# Patient Record
Sex: Male | Born: 1985 | Race: Black or African American | Hispanic: No | Marital: Single
Health system: Midwestern US, Community
[De-identification: ages and names within clinical notes are randomized; demographics above are authoritative.]

---

## 2020-09-27 NOTE — ED Notes (Signed)
ED Triage Note       ED Triage Adult Entered On:  09/27/2020 21:28 EDT    Performed On:  09/27/2020 21:24 EDT by Oneta Rack, RN, [place]               Triage   Numeric Rating Pain Scale :   8   Chief Complaint :   c/o smell or taste today. denies fever. c/o cough, bodyaches and weakness.   [place] Mode of Arrival :   Private vehicle   Infectious Disease Documentation :   Document assessment   Temperature Oral :   38.4 degC(Converted to: 101.1 degF)  (HI)    Heart Rate Monitored :   98 bpm   Respiratory Rate :   18 br/min   Systolic Blood Pressure :   128 mmHg   Diastolic Blood Pressure :   80 mmHg   SpO2 :   96 %   Oxygen Therapy :   Room air   Patient presentation :   None of the above   Chief Complaint or Presentation suggest infection :   Yes   Dosing Weight Obtained By :   Patient stated   Weight Dosing :   79 kg(Converted to: 174 lb 3 oz)    Height :   165 cm(Converted to: 5 ft 5 in)    Body Mass Index Dosing :   29 kg/m2   Iver Nestle - 09/27/2020 21:24 EDT   DCP GENERIC CODE   Tracking Acuity :   4   Tracking Group :   ED [employer]acking [employer]   [place] - 09/27/2020 21:24 EDT   ED General Section :   Document assessment   Pregnancy Status :   N/A   ED Allergies Section :   Document assessment   ED Reason for Visit Section :   Document assessment   ED Quick Assessment :   Patient appears awake, alert, oriented to baseline. Skin warm and dry. Moves all extremities. Respiration even and unlabored. Appears in no apparent distress.   Oneta Rack, RN, Jeannett Senior - 09/27/2020 21:24 EDT   Allergies   (As Of: 09/27/2020 21:28:46 EDT)   Allergies (Active)   No Known Allergies  Estimated Onset Date:   Unspecified ; Created By:   Merlinda Frederick; Reaction Status:   Active ; Category:   Drug ; Substance:   No Known Allergies ; Type:   Allergy ; Updated By:   Merlinda Frederick; Reviewed Date:   09/27/2020 21:25 EDT        Psycho-Social   Last 3 mo, thoughts killing self/others :   Patient denies   Right  click within box for Suspected Abuse policy link. :   None   Feels Safe Where Live :   Yes   ED Behavioral Activity Rating Scale :   4 - Quiet and awake (normal level of activity)   Iver Nestle - 09/27/2020 21:24 EDT   ED Reason for Visit   (As Of: 09/27/2020 21:28:46 EDT)   Problems(Active)    No Chronic Problems ([facility]  :NKP )  Name of Problem:   No Chronic Problems ; Recorder:   Fritz Pickerel; Code:   NKP ; Last Updated:   06/15/2015 10:47 EDT ; Life Cycle Date:   06/15/2015 ; Life Cycle Status:   Active ; Vocabulary:   [facility]          Diagnoses(Active)    Medical  screening exam  Date:   09/27/2020 ; Diagnosis Type:   Reason For Visit ; Confirmation:   Complaint of ; Clinical Dx:   Medical screening exam ; Classification:   Medical ; Clinical Service:   Emergency medicine ; Code:   PNED ; Probability:   0 ; Diagnosis Code:   [id number]

## 2020-09-27 NOTE — ED Notes (Signed)
 ED Patient Education Note     Patient Education Materials Follows:                  COVID-19 Testing, Precautions, & Infection  qXX Your COVID-19 lab test result is PENDING.   Your COVID-19 lab result is not available at this time. Please allow up 3 - 4 days for processing and continue to isolate at home for 5 days or unless you have a confirmed negative result (not detected).  Please follow the quarantine/isolation instructions on page 2.     q Your COVID-19 lab test result is POSITIVE.    Your COVID-19 lab result is positive ("detected") for the COVID-19 infection. Please follow all other discharge instructions given to you by your healthcare provider.  Please follow the quarantine/isolation instructions on page 2.    q Your COVID-19 lab test result is NEGATIVE.   Your COVID-19 lab result is negative ("not detected") for the COVID-19 infection. Please follow all other discharge instructions given to you by your healthcare provider      **** The quickest way to view or print your own test result is by the 'Patient Portal' at [email or web address] see below for further 'Patient Portal' details.  Note: In reviewing your COVID-19 result on the patient portal (under the 'results' tab) you may notice a variation in the test name that includes, "Coronavirus", "cNoV", "Corona", "SARS", or "COV2". These all relate to a COVID-19 test that you had performed at a [facility] [employer] location.         CDC Quarantine/Isolation Guidelines  q If you were exposed to COVID-19 and are NOT UP-TO-DATE on COVID-19 vaccinations   Stay home and quarantine until you receive your test result  If negative, you may return to normal activities  If your test result is positive, follow the instructions below  q If you were exposed to COVID-19 and are UP-TO-DATE on COVID-19 vaccinations   You do not need to stay home unless you develop symptoms   If you have symptoms, isolate at home until you receive your test result  If  negative, you may return to normal activities  If your test result is positive, follow the instructions below  q If you were exposed to COVID-19 and had confirmed COVID-19 within the   past 90 days   You do not need to stay home unless you develop symptoms   If you have symptoms, isolate at home until you receive your test result  If negative, you may return to normal activities  If your test result is positive, follow the instructions below    q If your COVID-19 test is POSITIVE   Stay home and isolate from others for at least 5 days   You may end your isolation after 5 days if your symptoms are improving and you have gone at least 24 hours without a fever (without taking Tylenol , ibuprofen, or any other medications to reduce your fever).continue your isolation for a total of 10 days if your symptoms are not improving or you still have a fever after 5 days   You should continue to wear a mask for 10 full days any time you are around others (inside or outside of your home)   Avoid sharing confined spaces with others as much as possible.  If you live in your home with other people, consider keeping a separate bedroom and bathroom just for your use    How to Access the [facility] Patient Portal  1. Go to: [email or web address]  2. You want the option of:  [facility]   If you do not have an account and are visiting the portal for the first time, select "Sign up now".   If you already have an account, select "Log into [facility]".       3. If you are signing up for a new account you will fill out a "Self-Enrollment for [facility]" page in which you will securely enter your first, last name, date of birth, social security number, and an identity verification prompt. *Tip:  when filling out the "Self-Enrollment for [facility]" enter your name using the same spelling that is on file with your physician's office or hospital. Once this page is filled out, hit the purple "Next" button at  the bottom of the page.   4. Verify your information and click the purple "Next Create Your Account" button.   5. After identity verification, you will create your username and password for your "[facility]" Patient Portal. You will then click the green "Create Account" button.   6. Once your account has been created, you will be taken back to a login screen. Log in with the username and password that you created in step 5.   If creating an account for a minor child, contact Medical Records at [phone] to receive a personal email invitation to enroll your child. Medical records is open M-F 8am-4:30pm. If you contact them after hours, select option 3 to leave a message and you will receive a call back the next business day.  ! If you are having issues logging into or accessing your account, contact [facility] support at [phone] available 7 days a week, 24 hours a day.          CDC COVID-19 Resources  For more information, visit.  [email or web address]                    (Scan these codes with an Apple or Android device)                       CDC Quarantine/Isolation                       What to do when                           COVID-19 Quarantine           Guidelines                                        when you are sick                                  VS Isolation  What is COVID-19?  COVID-19 stands for coronavirus disease 2019. It is caused by a virus called SARS-CoV-2. The virus first appeared in late 2019 and quickly spread around the world.  People with COVID-19 can have fever, cough, trouble breathing, and other symptoms. Problems with breathing happen when the  infection affects the lungs and causes pneumonia.  Most people who get COVID-19 will not get severely ill. But some do. In many areas, people have been told to stay home and away from other people. This is to try to slow the spread of the virus.    How is COVID-19 Spread?    The virus that causes COVID-19 mainly spreads from person to person. This usually happens when an infected person coughs, sneezes, or talks near other people. The virus can be passed easily between people who live together. But it can also spread at gatherings where people are talking close together, shaking hands, hugging, sharing food, or even singing together. Doctors also think it is possible to get sick if you touch a surface that has the virus on it and then touch your mouth, nose, or eyes.  A person can be infected, and spread the virus to others, even without having any symptoms. This is why keeping people apart is one of the best ways to slow the spread.  It is also possible for the virus to spread from an infected person to an animal, like a pet. But this seems to be uncommon. There is no evidence that a person could get the virus from a pet.  Experts do not think the virus is spread through food like some other viruses. There is also no evidence that it can be spread through the water in a pool or hot tub. But because the virus can spread when people are close together, things like swimming in a crowded pool are still risky.    What are the Symptoms of COVID-19?    Symptoms usually start 4 or 5 days after a person is infected with the virus. But in some people, it can take up to 2 weeks for symptoms to appear. Some people never show symptoms at all.  When symptoms do happen, they can include:   Fever    Shaking chills  Problems with sense of smell    Cough               Muscle aches           or taste   Trouble Breathing  Headache   Feeling tired    Sore throat  Some people have digestive problems like nausea or diarrhea. There  have also been some reports of rashes or other skin symptoms. For example, some people with COVID-19 get reddish-purple spots on their fingers or toes. But it's not clear why or how often this happens.  For most people, symptoms will get better within a few weeks. But in others, COVID-19 can lead to serious problems like pneumonia, not getting enough oxygen, heart problems, or even death. This risk gets higher as people get older. It is also higher in people who have other health problems like serious heart disease, chronic kidney disease, type 2 diabetes, chronic obstructive pulmonary disease (COPD), sickle cell disease, or obesity. People who have a weak immune system for other reasons (for example, HIV infection or certain medicines), asthma, cystic fibrosis, type 1 diabetes, or high blood pressure might also be at higher risk for serious problems.  What should I do if  I have symptoms?  If you have a fever, cough, trouble breathing, or other symptoms of COVID-19, call your doctor or nurse. They will ask about your symptoms. They might also ask about any recent travel and whether you have been around anyone who might be sick.  If your symptoms are not severe, it is best to call before you go in. The staff can tell you what to do and whether you need to be seen in person. Many people with only mild symptoms should stay home and avoid other people until they get better. If you do need to go to the clinic or hospital, cover your nose and mouth with cloth. This helps protect other people. The staff might also have you wait someplace away from other people.  If you are severely ill and need to go to the clinic or hospital right away, you should still call ahead if possible. This way the staff can care for you while taking steps to protect others. If you think you are having a medical emergency, dial 9-1-1.      Excuse from Work/School  __________________________________ needs to be excused from:  q Work  q  School  Beginning ________________ through ________________  [employer] Name (printed):   ________________________________________   [facility] Provider (signature):   __________________________________________  For Clearance to Return to Work:  If your COVID-19 test result is NEGATIVE (not detected), you may return to work once you feel well.  When completed, you may print your test result from the [facility] Patient Portal (See attached Portal instructions). A printed NEGATIVE result, along with this form, is your clearance to return to work.  If your COVID-19 test result is POSITIVE (detected), Isolate for 5 days and may end isolation if no fever but continue face masking around others for 5 more days. If fever present on day 5, continue isolation until fever resolves before ending isolation and beginning face masking around others.   It is not recommended to get a repeat test to see if it's negative, because you may continue to test positive for a long time even when you are no longer sick. If your employer has questions about these recommendations, they can contact the [facility] at [phone].                                   Excuse from Work, [employer], or Physical Activity      ________[employer] [place]___________________ needs to be excused from:  __x__ Work  _____ [employer]  _____ Physical activity        _x___ He or she may return to full physical activity as of _____07/21/2022_______________.        Health Care Provider Name (printed): _____Dr Dalu_________________________________   Health Care Provider (signature): ___________________________________________  Date: ___0713/22_____________  This information is not intended to replace advice given to you by your health care provider. Make sure you discuss any questions you have with your health care provider.

## 2020-09-27 NOTE — ED Notes (Signed)
 ED Patient Summary              [facility]  [address], [place], [place] [address]  [phone]  Discharge Instructions (Patient)  Name: Spencer Strickland, Spencer Strickland  DOB: 17-Aug-1985                   MRN: [id number]                   FIN: [id number]  Reason For Visit: Medical screening exam; LOSS TASTE AND SMELL  Final Diagnosis: Suspected COVID-19 virus infection     Visit Date: 09/27/2020 21:22:00  Address: [address] [place] [address]  Phone: [phone]     Emergency Department Providers:        Primary Physician:      [facility] would like to thank you for allowing us  to assist you with your healthcare needs. The following includes patient education materials and information regarding your injury/illness.     Follow-up Instructions:  You were seen today on an emergency basis. Please contact your primary care doctor for a follow up appointment. If you received a referral to a specialist doctor, it is important you follow-up as instructed.    It is important that you call your follow-up doctor to schedule and confirm the location of your next appointment. Your doctor may practice at multiple locations. The office location of your follow-up appointment may be different to the one written on your discharge instructions.    If you do not have a primary care doctor, please call [phone] for help in finding a [facility] Provider. For help in finding a specialist doctor, please call [phone].    If your condition gets worse before your follow-up with your primary care doctor or specialist, please return to the Emergency Department.      Coronavirus 2019 (COVID-19) Reminders:     Patients age 58 - 81, with parental consent, and patients over age 21 can make an appointment for a COVID-19 vaccine. Patients can contact their [facility] doctors' offices to schedule an appointment to  receive the COVID-19 vaccine. Patients who do not have a [facility] physician can call [phone] to schedule vaccination appointments.      Follow Up Appointments:  Primary Care Provider:     Name: PCP,  NONE     Phone:                  With: Address: When:   Follow up with primary care provider  Within 1 week   Comments:   Return to ED if symptoms worsen              Post [facility] SERVICES%>          Medications that have not changed  Other Medications  naproxen (naproxen sodium 550 mg oral tablet)   Last Dose:____________________      Allergy Info: No Known Allergies     Discharge Additional Information          Discharge Patient 09/27/20 22:02:00 EDT      Patient Education Materials:                         Excuse from Work, [employer], or Physical Activity      ________[employer]___________________ needs  to be excused from:  __x__ Work  _____ [employer]  _____ Physical activity        _x___ He or she may return to full physical activity as of _____07/21/2022_______________.        Health Care Provider Name (printed): _____Dr Dalu_________________________________   Health Care Provider (signature): ___________________________________________  Date: ___0713/22_____________  This information is not intended to replace advice given to you by your health care provider. Make sure you discuss any questions you have with your health care provider.                          COVID-19 Testing, Precautions, & Infection  qXX Your COVID-19 lab test result is PENDING.   Your COVID-19 lab result is not available at this time. Please allow up 3 - 4 days for processing and continue to isolate at home for 5 days or unless you have a confirmed negative result (not detected).  Please follow the quarantine/isolation instructions on page 2.     q Your COVID-19 lab test result is POSITIVE.    Your COVID-19 lab result is positive ("detected") for the COVID-19 infection. Please follow all other discharge  instructions given to you by your healthcare provider.  Please follow the quarantine/isolation instructions on page 2.    q Your COVID-19 lab test result is NEGATIVE.   Your COVID-19 lab result is negative ("not detected") for the COVID-19 infection. Please follow all other discharge instructions given to you by your healthcare provider      **** The quickest way to view or print your own test result is by the 'Patient Portal' at [email or web address] see below for further 'Patient Portal' details.  Note: In reviewing your COVID-19 result on the patient portal (under the 'results' tab) you may notice a variation in the test name that includes, "Coronavirus", "cNoV", "Corona", "SARS", or "COV2". These all relate to a COVID-19 test that you had performed at a [facility] [employer] location.         CDC Quarantine/Isolation Guidelines  q If you were exposed to COVID-19 and are NOT UP-TO-DATE on COVID-19 vaccinations   Stay home and quarantine until you receive your test result  If negative, you may return to normal activities  If your test result is positive, follow the instructions below  q If you were exposed to COVID-19 and are UP-TO-DATE on COVID-19 vaccinations   You do not need to stay home unless you develop symptoms   If you have symptoms, isolate at home until you receive your test result  If negative, you may return to normal activities  If your test result is positive, follow the instructions below  q If you were exposed to COVID-19 and had confirmed COVID-19 within the   past 90 days   You do not need to stay home unless you develop symptoms   If you have symptoms, isolate at home until you receive your test result  If negative, you may return to normal activities  If your test result is positive, follow the instructions below    q If your COVID-19 test is POSITIVE   Stay home and isolate from others for at least 5 days   You may end your isolation after 5 days if your symptoms are  improving and you have gone at least 24 hours without a fever (without taking Tylenol , ibuprofen, or any other medications to reduce your fever).continue your isolation for a total of 10  days if your symptoms are not improving or you still have a fever after 5 days   You should continue to wear a mask for 10 full days any time you are around others (inside or outside of your home)   Avoid sharing confined spaces with others as much as possible.  If you live in your home with other people, consider keeping a separate bedroom and bathroom just for your use    How to Access the [facility] Patient Portal  1. Go to: [email or web address]  2. You want the option of:  [facility]   If you do not have an account and are visiting the portal for the first time, select "Sign up now".   If you already have an account, select "Log into [facility]".       3. If you are signing up for a new account you will fill out a "Self-Enrollment for [facility]" page in which you will securely enter your first, last name, date of birth, social security number, and an identity verification prompt. *Tip:  when filling out the "Self-Enrollment for [facility]" enter your name using the same spelling that is on file with your physician's office or hospital. Once this page is filled out, hit the purple "Next" button at the bottom of the page.   4. Verify your information and click the purple "Next Create Your Account" button.   5. After identity verification, you will create your username and password for your "[facility]" Patient Portal. You will then click the green "Create Account" button.   6. Once your account has been created, you will be taken back to a login screen. Log in with the username and password that you created in step 5.   If creating an account for a minor child, contact Medical Records at [phone] to receive a personal email invitation to enroll your child. Medical records is open M-F  8am-4:30pm. If you contact them after hours, select option 3 to leave a message and you will receive a call back the next business day.  ! If you are having issues logging into or accessing your account, contact [employer] support at [phone] available 7 days a week, 24 hours a day.          CDC COVID-19 Resources  For more information, visit.  [email or web address].se                    (Scan these codes with an Apple or Android device)                       CDC Quarantine/Isolation                       What to do when                           COVID-19 Quarantine           Guidelines                                        when you are sick                                  VS Isolation  What is COVID-19?  COVID-19 stands for coronavirus disease 2019. It is caused by a virus called SARS-CoV-2. The virus first appeared in late 2019 and quickly spread around the world.  People with COVID-19 can have fever, cough, trouble breathing, and other symptoms. Problems with breathing happen when the infection affects the lungs and causes pneumonia.  Most people who get COVID-19 will not get severely ill. But some do. In many areas, people have been told to stay home and away from other people. This is to try to slow the spread of the virus.    How is COVID-19 Spread?    The virus that causes COVID-19 mainly spreads from person to person. This usually happens when an infected person coughs, sneezes, or talks near other people. The virus can be passed easily between people who live together. But it can also spread at gatherings where people are talking close together, shaking hands, hugging, sharing  food, or even singing together. Doctors also think it is possible to get sick if you touch a surface that has the virus on it and then touch your mouth, nose, or eyes.  A person can be infected, and spread the virus to others, even without having any symptoms. This is why keeping people apart is one of the best ways to slow the spread.  It is also possible for the virus to spread from an infected person to an animal, like a pet. But this seems to be uncommon. There is no evidence that a person could get the virus from a pet.  Experts do not think the virus is spread through food like some other viruses. There is also no evidence that it can be spread through the water in a pool or hot tub. But because the virus can spread when people are close together, things like swimming in a crowded pool are still risky.    What are the Symptoms of COVID-19?    Symptoms usually start 4 or 5 days after a person is infected with the virus. But in some people, it can take up to 2 weeks for symptoms to appear. Some people never show symptoms at all.  When symptoms do happen, they can include:   Fever    Shaking chills  Problems with sense of smell    Cough               Muscle aches           or taste   Trouble Breathing  Headache   Feeling tired    Sore throat  Some people have digestive problems like nausea or diarrhea. There have also been some reports of rashes or other skin symptoms. For example, some people with COVID-19 get reddish-purple spots on their fingers or toes. But it's not clear why or how often this happens.  For most people, symptoms will get better within a few weeks. But in others, COVID-19 can lead to serious problems like pneumonia, not getting enough oxygen, heart problems, or even death. This risk gets higher as people get older. It is also higher in people who have other health problems like serious heart disease, chronic kidney disease, type 2 diabetes, chronic obstructive pulmonary disease (COPD),  sickle cell disease, or obesity. People who have a weak immune system for other reasons (for example, HIV infection or certain medicines), asthma, cystic fibrosis, type 1 diabetes, or high blood pressure might also be at higher risk for serious problems.  What should I do if  I have symptoms?  If you have a fever, cough, trouble breathing, or other symptoms of COVID-19, call your doctor or nurse. They will ask about your symptoms. They might also ask about any recent travel and whether you have been around anyone who might be sick.  If your symptoms are not severe, it is best to call before you go in. The staff can tell you what to do and whether you need to be seen in person. Many people with only mild symptoms should stay home and avoid other people until they get better. If you do need to go to the clinic or hospital, cover your nose and mouth with cloth. This helps protect other people. The staff might also have you wait someplace away from other people.  If you are severely ill and need to go to the clinic or hospital right away, you should still call ahead if possible. This way the staff can care for you while taking steps to protect others. If you think you are having a medical emergency, dial 9-1-1.      Excuse from Work/School  __________________________________ needs to be excused from:  q Work  q School  Beginning ________________ through ________________  [employer] Name (printed):   ________________________________________   [facility] Provider (signature):   __________________________________________  For Clearance to Return to Work:  If your COVID-19 test result is NEGATIVE (not detected), you may return to work once you feel well.  When completed, you may print your test result from the [facility] Patient Portal (See attached Portal instructions). A printed NEGATIVE result, along with this form, is your clearance to return to work.  If your COVID-19 test result is POSITIVE  (detected), Isolate for 5 days and may end isolation if no fever but continue face masking around others for 5 more days. If fever present on day 5, continue isolation until fever resolves before ending isolation and beginning face masking around others.   It is not recommended to get a repeat test to see if it's negative, because you may continue to test positive for a long time even when you are no longer sick. If your employer has questions about these recommendations, they can contact the [facility] at [phone].             ---------------------------------------------------------------------------------------------------------------------  [facility] allows patients to review your COVID and other test results as well as discharge documents from any [facility], Emergency Department, surgical center or outpatient lab. Test results are typically available 36 hours after the test is completed.     [facility] encourages you to self-enroll in the [facility] Patient Portal.     To begin your self-enrollment process, please visit [email or web address] Under [facility], click on "Sign up now".     NOTE: You must be 16 years and older to use [facility] Self-Enroll online. If you are a parent, caregiver, or guardian; you need an invite to access your child's or dependent's health records. To obtain an invite, contact the Medical Records department at [phone] Monday through Friday, 8-4:30, select option 3 . If we receive your call afterhours, we will return your call the next business day.     If you have issues trying to create or access your account, contact [employer] support at [phone] available 7 days a week 24 hours a day.     Comment:

## 2020-09-27 NOTE — Discharge Summary (Signed)
ED Clinical Summary                     [facility]  [address]  [place], [place], [address]  [phone]          PERSON INFORMATION  Name: Spencer Strickland, Spencer Strickland Age:  35 Years DOB: May 20, 1985   Sex: Male Language: English PCP: PCP,  NONE   Marital Status: Single Phone: [phone] Med Service: MED-Medicine   MRN: [id number] Acct# [id number] Arrival: 09/27/2020 21:22:00   Visit Reason: Medical screening exam; LOSS TASTE AND SMELL Acuity: 4 LOS: 000 00:48   Address:    [address] [place] [address]   Diagnosis:    Suspected COVID-19 virus infection  Medications:          Medications that have not changed  Other Medications  naproxen (naproxen sodium 550 mg oral tablet)   Last Dose:____________________      Medications Administered During Visit:                Medication Dose Route   ibuprofen 800 mg Oral               Allergies      No Known Allergies      Major Tests and Procedures:  The following procedures and tests were performed during your ED visit.  COMMON PROCEDURES%>  COMMON PROCEDURES COMMENTS%>                PROVIDER INFORMATION               Provider Role Assigned Erasmo Leventhal ED Nurse 09/27/2020 21:30:06    Bartholomew Boards ED MidLevel 09/27/2020 21:30:50 09/27/2020 21:33:07   DALU, DAVID-MD ED Provider 09/27/2020 21:32:16        Attending Physician:  DEFAULT,  DOCTOR      Admit Doc  DEFAULT,  DOCTOR     Consulting Doc       VITALS INFORMATION  Vital Sign Triage Latest   Temp Oral ORAL_1%> ORAL%>   Temp Temporal TEMPORAL_1%> TEMPORAL%>   Temp Intravascular INTRAVASCULAR_1%> INTRAVASCULAR%>   Temp Axillary AXILLARY_1%> AXILLARY%>   Temp Rectal RECTAL_1%> RECTAL%>   02 Sat 96 % 96 %   Respiratory Rate RATE_1%> RATE%>   Peripheral Pulse Rate PULSE RATE_1%> PULSE RATE%>   Apical Heart Rate HEART RATE_1%> HEART RATE%>   Blood Pressure BLOOD PRESSURE_1%>/ BLOOD PRESSURE_1%>80 mmHg BLOOD PRESSURE%> / BLOOD PRESSURE%>80 mmHg                  Immunizations      No Immunizations Documented This Visit          DISCHARGE INFORMATION   Discharge Disposition: H Outpt-Sent Home   Discharge Location:  Home   Discharge Date and Time:  09/27/2020 22:10:54   ED Checkout Date and Time:  09/27/2020 22:10:54     DEPART REASON INCOMPLETE INFORMATION               Depart Action Incomplete Reason   Interactive View/I&O Recently assessed               Problems      Active           No Chronic Problems              Smoking Status      Current every day smoker         PATIENT  EDUCATION INFORMATION  Instructions:     Excuse from Work, School, or Physical Activity with logo [phone]);   COVID Pending Discharge Instructions w/work note update (Custom)     Follow up:                   With: Address: When:   Follow up with primary care provider  Within 1 week   Comments:   Return to ED if symptoms worsen              ED PROVIDER DOCUMENTATION     Patient:   Spencer Strickland, Spencer Strickland            MRN: [id number]            FIN: [id number]               Age:   35 years     Sex:  Male     DOB:  1985-10-10   Associated Diagnoses:   Suspected COVID-19 virus infection   Author:   Lorinda Creed,  DAVID-MD      Basic Information   Additional information: Chief Complaint from Nursing Triage Note   Chief Complaint  Chief Complaint: c/o smell or taste today. denies fever. c/o cough, bodyaches and weakness. (09/27/20 21:24:00).      History of Present Illness   35 year old male with chief plaint of fever.  Patient states he developed fever and muscle aches this afternoon at work.  No vomiting.  No headache.  He is otherwise healthy..        Review of Systems   Constitutional symptoms:  Fever.   Respiratory symptoms:  No shortness of breath,    Cardiovascular symptoms:  No chest pain,    Gastrointestinal symptoms:  No abdominal pain,    Musculoskeletal symptoms:  Muscle pain.             Additional review of systems information: All other systems reviewed and otherwise negative.      Health Status    Allergies:    Allergic Reactions (Selected)  No Known Allergies.      Past Medical/ Family/ Social History   Medical history: Reviewed as documented in chart.   Surgical history:    None ([id number])..   Social history:    Social & Psychosocial Habits    Tobacco  06/15/2015  Use: Current every day smoker    Type: Cigarettes    Tobacco use per day: 5  , Reviewed as documented in chart.   Problem list:    Active Problems (1)  No Chronic Problems   , per nurse's notes.      Physical Examination               Vital Signs   Oxygen saturation.   General:  Alert, no acute distress.    Skin:  Warm, dry, pink.    Head:  Normocephalic.   Neck:  Supple, trachea midline.    Eye:  Pupils are equal, round and reactive to light, extraocular movements are intact.    Ears, nose, mouth and throat:  Oral mucosa moist.   Cardiovascular   Respiratory:  Respirations are non-labored.   Back:  Normal range of motion.   Musculoskeletal:  Normal ROM, no tenderness.    Chest wall   Neurological:  Alert and oriented to person, place, time, and situation, No focal neurological deficit observed.    Lymphatics   Psychiatric      Reexamination/ Reevaluation   Vital signs  Basic Oxygen Information   09/27/2020 21:24 EDT Oxygen Therapy Room air    SpO2 96 %         Impression and Plan   Diagnosis   Suspected COVID-19 virus infection (ICD10-CM Z20.822, Discharge, Medical)   Plan   Condition: Stable.    Disposition: Discharged: time  09/27/2020 21:50:00.    Patient was given the following educational materials:   COVID Pending Discharge Instructions w/work note update (Custom),   COVID Pending Discharge Instructions w/work note update (Custom).    Follow up with: Follow up with primary care provider Within 1 week Return to ED if symptoms worsen.    Counseled: Patient, Regarding diagnosis, Regarding diagnostic results, Regarding treatment plan, Patient indicated understanding of instructions.

## 2020-09-27 NOTE — ED Provider Notes (Signed)
General Medical Problem *ED        Patient:   Spencer Strickland, Spencer Strickland            MRN: [id number]            FIN: [id number]               Age:   35 years     Sex:  Male     DOB:  02-Feb-1986   Associated Diagnoses:   Suspected COVID-19 virus infection   Author:   Lorinda Creed,  Kariel Skillman-MD      Basic Information   Additional information: Chief Complaint from Nursing Triage Note   Chief Complaint  Chief Complaint: c/o smell or taste today. denies fever. c/o cough, bodyaches and weakness. (09/27/20 21:24:00).      History of Present Illness   35 year old male with chief plaint of fever.  Patient states he developed fever and muscle aches this afternoon at work.  No vomiting.  No headache.  He is otherwise healthy..        Review of Systems   Constitutional symptoms:  Fever.   Respiratory symptoms:  No shortness of breath,    Cardiovascular symptoms:  No chest pain,    Gastrointestinal symptoms:  No abdominal pain,    Musculoskeletal symptoms:  Muscle pain.             Additional review of systems information: All other systems reviewed and otherwise negative.      Health Status   Allergies:    Allergic Reactions (Selected)  No Known Allergies.      Past Medical/ Family/ Social History   Medical history: Reviewed as documented in chart.   Surgical history:    None ([id number])..   Social history:    Social & Psychosocial Habits    Tobacco  06/15/2015  Use: Current every day smoker    Type: Cigarettes    Tobacco use per day: 5  , Reviewed as documented in chart.   Problem list:    Active Problems (1)  No Chronic Problems   , per nurse's notes.      Physical Examination               Vital Signs   Oxygen saturation.   General:  Alert, no acute distress.    Skin:  Warm, dry, pink.    Head:  Normocephalic.   Neck:  Supple, trachea midline.    Eye:  Pupils are equal, round and reactive to light, extraocular movements are intact.    Ears, nose, mouth and throat:  Oral mucosa moist.   Cardiovascular   Respiratory:  Respirations are  non-labored.   Back:  Normal range of motion.   Musculoskeletal:  Normal ROM, no tenderness.    Chest wall   Neurological:  Alert and oriented to person, place, time, and situation, No focal neurological deficit observed.    Lymphatics   Psychiatric      Reexamination/ Reevaluation   Vital signs   Basic Oxygen Information   09/27/2020 21:24 EDT Oxygen Therapy Room air    SpO2 96 %         Impression and Plan   Diagnosis   Suspected COVID-19 virus infection (ICD10-CM Z20.822, Discharge, Medical)   Plan   Condition: Stable.    Disposition: Discharged: time  09/27/2020 21:50:00.    Patient was given the following educational materials:   COVID Pending Discharge Instructions w/work note update (Custom),   COVID Pending Discharge Instructions w/work  note update (Custom).    Follow up with: Follow up with primary care provider Within 1 week Return to ED if symptoms worsen.    Counseled: Patient, Regarding diagnosis, Regarding diagnostic results, Regarding treatment plan, Patient indicated understanding of instructions.    Signature Line     Electronically Signed on 09/27/2020 09:51 PM EDT   ________________________________________________   Lorinda Creed,  Grettell Ransdell-MD

## 2020-09-27 NOTE — ED Notes (Signed)
ED Triage Note       ED Secondary Triage Entered On:  09/27/2020 21:34 EDT    Performed On:  09/27/2020 21:33 EDT by Myna Hidalgo               General Information   Barriers to Learning :   None evident   Languages :   English   Interpreter Called :   No   Last Tetanus :   Greater than 5 years   Immunizations Current :   No   Influenza Vaccine Status :   Not received   Pt. Currently Receiving Radiation :   No   COVID-19 Vaccine Status :   2 Doses received   2 Doses Received Manufacturer :   Pfizer vaccine   ED Home Meds Section :   Document assessment   [facility] ED Fall Risk Section :   Document assessment   ED History Section :   Document assessment   ED Advance Directives Section :   Document assessment   ED Palliative Screen :   N/A (prefilled for <65yo)   Kapp,  Lizette-RN - 09/27/2020 21:33 EDT   (As Of: 09/27/2020 21:34:16 EDT)   Problems(Active)    No Chronic Problems (Cerner  :NKP )  Name of Problem:   No Chronic Problems ; Recorder:   Fritz Pickerel; Code:   NKP ; Last Updated:   06/15/2015 10:47 EDT ; Life Cycle Date:   06/15/2015 ; Life Cycle Status:   Active ; Vocabulary:   Cerner          Diagnoses(Active)    Medical screening exam  Date:   09/27/2020 ; Diagnosis Type:   Reason For Visit ; Confirmation:   Complaint of ; Clinical Dx:   Medical screening exam ; Classification:   Medical ; Clinical Service:   Emergency medicine ; Code:   PNED ; Probability:   0 ; Diagnosis Code:   [id number]             -    Procedure History   (As Of: 09/27/2020 21:34:16 EDT)     Anesthesia Minutes:   0 ; Procedure Name:   None ; Procedure Minutes:   0            [facility] Fall Risk Assessment Tool   Hx of falling last 3 months ED Fall :   No   Patient confused or disoriented ED Fall :   No   Patient intoxicated or sedated ED Fall :   No   Patient impaired gait ED Fall :   No   Use a mobility assistance device ED Fall :   No   Patient altered elimination ED Fall :   No   [facility] ED Fall Score :   0     Andreas Ohm  Lizette-RN - 09/27/2020 21:33 EDT   ED Advance Directive   Advance Directive :   No   Myna Hidalgo - 09/27/2020 21:33 EDT   Social History   Social History   (As Of: 09/27/2020 21:34:16 EDT)   Tobacco:        Current every day smoker, Cigarettes, 5 per day.   (Last Updated: 06/15/2015 10:47:52 EDT by Fritz Pickerel)            Med Hx   Medication List   (As Of: 09/27/2020 21:34:16 EDT)   Home Meds    naproxen  :   naproxen ; Status:  Documented ; Ordered As Mnemonic:   naproxen sodium 550 mg oral tablet ; Simple Display Line:   0 Refill(s) ; Catalog Code:   naproxen ; Order Dt/Tm:   09/27/2020 21:34:11 EDT

## 2020-09-28 LAB — COVID-19 & INFLUENZA COMBO
INFLUENZA A: NOT DETECTED
INFLUENZA B: NOT DETECTED
SARS-CoV-2: DETECTED — AB

## 2021-02-15 ENCOUNTER — Inpatient Hospital Stay: Admit: 2021-02-15 | Discharge: 2021-02-16 | Disposition: A | Discharge status: Home / Self Care

## 2021-02-15 DIAGNOSIS — R197 Diarrhea, unspecified: Secondary | ICD-10-CM

## 2021-02-15 DIAGNOSIS — R112 Nausea with vomiting, unspecified: Secondary | ICD-10-CM

## 2021-02-15 LAB — COVID-19 & INFLUENZA COMBO (LIAT HOSPITAL)
INFLUENZA A: NOT DETECTED
INFLUENZA B: NOT DETECTED
SARS-CoV-2: NOT DETECTED

## 2021-02-15 MED ORDER — DICYCLOMINE HCL 10 MG PO CAPS
10 MG | ORAL_CAPSULE | Freq: Four times a day (QID) | ORAL | 0 refills | Status: DC | PRN
Start: 2021-02-15 — End: 2021-05-31

## 2021-02-15 MED ORDER — ALUM & MAG HYDROXIDE-SIMETH 200-200-20 MG/5ML PO SUSP
200-200-20 MG/5ML | Freq: Once | ORAL | Status: AC
Start: 2021-02-15 — End: 2021-02-15
  Administered 2021-02-15: 30 mL via ORAL

## 2021-02-15 MED ORDER — DICYCLOMINE HCL 10 MG PO CAPS
10 MG | Freq: Once | ORAL | Status: AC
Start: 2021-02-15 — End: 2021-02-15
  Administered 2021-02-15: 10 mg via ORAL

## 2021-02-15 MED ORDER — ONDANSETRON 4 MG PO TBDP
4 MG | ORAL_TABLET | Freq: Three times a day (TID) | ORAL | 0 refills | Status: DC | PRN
Start: 2021-02-15 — End: 2021-05-31

## 2021-02-15 MED FILL — DICYCLOMINE HCL 10 MG PO CAPS: 10 mg | ORAL | Qty: 1

## 2021-02-15 MED FILL — MAG-AL PLUS 200-200-20 MG/5ML PO LIQD: 200-200-20 MG/5ML | ORAL | Qty: 30

## 2021-02-15 NOTE — ED Provider Notes (Signed)
[facility] EM[facility] DEPT  EMERGENCY DEPARTMENT ENCOUNTER      Pt Name: Spencer Strickland  MRN: [id number]  Birthdate 1986-03-11  Date of evaluation: 02/15/2021  Provider: Jeanella Craze, PA-C    CHIEF COMPLAINT       Chief Complaint   Patient presents with    Diarrhea    Nausea     For two days    Abdominal Pain         HISTORY OF PRESENT ILLNESS    HPI   35 y.o. male with no significant PMH presents to ED c/o diarrhea with mild intermittent diffuse abdominal cramping only with episodes of diarrhea since yesterday.  Patient also reports nausea with 2 episodes of vomiting yesterday but no vomiting today.  Reports 5 episodes of diarrhea today. Also reports loss of taste and smell. Denies any fevers, chills, focal abdominal pain, CP, SOB, cough, congestion, LUTS.       REVIEW OF SYSTEMS       Review of Systems   Constitutional:  Negative for chills, diaphoresis and fever.   HENT:  Negative for congestion, rhinorrhea and sore throat.    Eyes:  Negative for visual disturbance.   Respiratory:  Negative for cough and shortness of breath.    Cardiovascular:  Negative for chest pain, palpitations and leg swelling.   Gastrointestinal:  Positive for diarrhea, nausea and vomiting. Negative for abdominal pain and blood in stool.   Genitourinary:  Negative for difficulty urinating, flank pain and hematuria.   Musculoskeletal:  Negative for back pain and neck pain.   Skin:  Negative for color change and rash.   Neurological:  Negative for dizziness, syncope, light-headedness and headaches.     PAST MEDICAL HISTORY   No past medical history on file.    SURGICAL HISTORY     No past surgical history on file.    CURRENT MEDICATIONS       Discharge Medication List as of 02/15/2021  7:01 PM          ALLERGIES     Patient has no known allergies.    FAMILY HISTORY     No family history on file.     SOCIAL HISTORY       Social History     Socioeconomic History    Marital status: Single       PHYSICAL EXAM     Vitals:    Vitals:     02/15/21 1800 02/15/21 1905   BP: 107/86 110/88   Pulse: 79 83   Resp: 14 14   Temp: 98.6 ??F (37 ??C)    TempSrc: Oral    SpO2: 100%    Weight: 77.9 kg    Height: 5\' 5"  (1.651 m)        Physical Exam  Vitals and nursing note reviewed.   Constitutional:       General: He is not in acute distress.     Appearance: Normal appearance. He is not ill-appearing, toxic-appearing or diaphoretic.   HENT:      Head: Normocephalic and atraumatic.      Mouth/Throat:      Mouth: Mucous membranes are moist.   Eyes:      Extraocular Movements: Extraocular movements intact.      Pupils: Pupils are equal, round, and reactive to light.   Cardiovascular:      Rate and Rhythm: Normal rate and regular rhythm.      Pulses: Normal pulses.  Heart sounds: Normal heart sounds.   Pulmonary:      Effort: Pulmonary effort is normal. No respiratory distress.      Breath sounds: Normal breath sounds.   Abdominal:      General: Abdomen is flat. Bowel sounds are normal. There is no distension.      Palpations: Abdomen is soft.      Tenderness: There is no abdominal tenderness. There is no guarding.   Musculoskeletal:      Cervical back: Normal range of motion and neck supple.      Right lower leg: No edema.      Left lower leg: No edema.   Skin:     General: Skin is warm and dry.      Capillary Refill: Capillary refill takes less than 2 seconds.   Neurological:      General: No focal deficit present.      Mental Status: He is alert and oriented to person, place, and time.       Procedures    DIAGNOSTIC RESULTS       RADIOLOGY:   No orders to display       LABS:  Labs Reviewed   COVID-19 & INFLUENZA COMBO [facility])       All other labs were within normal range or not returned as of this dictation.    EMERGENCY DEPARTMENT COURSE/MDM:   MDM  Pt is well appearing in NAD. Vitals are WNL. Exam is entirely normal. Abdomen soft, non-distended, non-tender, normoactive BS. No signs of dehydration. Sxs are c/w viral gastroenteritis. COVID/Flu swab  obtained which are negative. Pt given Maalox and Bentyl in ED for symptomatic control which he states improved his symptoms. He is tolerating PO well. Prescribed ODT zofran and bentyl for symptomatic control at home and instructed OTC imodium for diarrhea. Pt understands and agrees with plan. D/c with strict return precautions.          CONSULTS:  None      FINAL IMPRESSION      1. Nausea vomiting and diarrhea          DISPOSITION/PLAN   DISPOSITION Decision To Discharge 02/15/2021 07:00:43 PM      PATIENT REFERRED TO:  No follow-up provider specified.    DISCHARGE MEDICATIONS:  Discharge Medication List as of 02/15/2021  7:01 PM        START taking these medications    Details   ondansetron (ZOFRAN-ODT) 4 MG disintegrating tablet Take 1 tablet by mouth 3 times daily as needed for Nausea or Vomiting, Disp-21 tablet, R-0Print      dicyclomine (BENTYL) 10 MG capsule Take 1 capsule by mouth 4 times daily as needed (cramping), Disp-20 capsule, R-0Print           Controlled Substances Monitoring:     No flowsheet data found.    (Please note that portions of this note were completed with a voice recognition program.  Efforts were made to edit the dictations but occasionally words are mis-transcribed.)    Jeanella Craze, PA-C (electronically signed)  Attending Emergency Physician           Jeanella Craze, PA-C  02/16/21 0025

## 2021-02-15 NOTE — Discharge Instructions (Addendum)
Take imodium for your diarrhea. I am prescribing a medication for nausea as needed and for abdominal cramping as needed. Follow-up with your primary care provider.  Return to the ER if you experience any new or worsening symptoms.

## 2021-04-06 ENCOUNTER — Inpatient Hospital Stay: Admit: 2021-04-06 | Discharge: 2021-04-06 | Disposition: A | Discharge status: Home / Self Care

## 2021-04-06 DIAGNOSIS — J020 Streptococcal pharyngitis: Secondary | ICD-10-CM

## 2021-04-06 LAB — COVID-19 & INFLUENZA COMBO (LIAT HOSPITAL)
INFLUENZA A: NOT DETECTED
INFLUENZA B: NOT DETECTED
SARS-CoV-2: NOT DETECTED

## 2021-04-06 LAB — RAPID STREP SCREEN: RAPID STREP RESULT: POSITIVE

## 2021-04-06 MED ORDER — ACETAMINOPHEN 500 MG PO TABS
500 MG | Freq: Once | ORAL | Status: AC
Start: 2021-04-06 — End: 2021-04-06
  Administered 2021-04-06: 21:00:00 1000 mg via ORAL

## 2021-04-06 MED ORDER — AMOXICILLIN 500 MG PO CAPS
500 MG | ORAL_CAPSULE | Freq: Two times a day (BID) | ORAL | 0 refills | Status: AC
Start: 2021-04-06 — End: 2021-04-16

## 2021-04-06 MED FILL — SM PAIN RELIEF 500 MG PO TABS: 500 mg | ORAL | Qty: 2

## 2021-04-06 NOTE — ED Provider Notes (Signed)
[facility] DE[facility]  EMERGENCY DEPARTMENT ENCOUNTER      Pt Name: Spencer BottsLindell Antonio Robers  MRN: [id number]  Birthdate 03/18/1986  Date of evaluation: 04/06/2021  Provider: Janett BillowSarah Kelly Donovin Kraemer, PA-C    CHIEF COMPLAINT       Chief Complaint   Patient presents with    Generalized Body Aches     Pt reports generalized body aches, sore throat, chills, and loss of taste since yesterdat         HISTORY OF PRESENT ILLNESS    The history is provided by the patient.     36 y.o. male presents to the ER with generalized body aches, feeling hot and cold, sore throat, loss of taste, headache, congestion x1 day.  Patient has not taken any over-the-counter medications yet today.  He denies any significant past medical history.  Requesting COVID/flu and strep test.  Denies any other complaints.  Denies neck pain, nausea, vomiting, shortness of breath, difficulty swallowing, chest pain, abdominal pain.      Nursing Notes were reviewed.    REVIEW OF SYSTEMS     Review of Systems   All other systems reviewed and are negative.    Except as noted above the remainder of the review of systems was reviewed and negative.       PAST MEDICAL HISTORY   No past medical history on file.    SURGICAL HISTORY     No past surgical history on file.    CURRENT MEDICATIONS       Discharge Medication List as of 04/06/2021  4:11 PM        CONTINUE these medications which have NOT CHANGED    Details   ondansetron (ZOFRAN-ODT) 4 MG disintegrating tablet Take 1 tablet by mouth 3 times daily as needed for Nausea or Vomiting, Disp-21 tablet, R-0Print      dicyclomine (BENTYL) 10 MG capsule Take 1 capsule by mouth 4 times daily as needed (cramping), Disp-20 capsule, R-0Print             ALLERGIES     Patient has no known allergies.    FAMILY HISTORY     No family history on file.     SOCIAL HISTORY       Social History     Socioeconomic History    Marital status: Single       SCREENINGS                               CIWA Assessment  BP: (!) 149/82  Heart Rate: 94                  PHYSICAL EXAM       ED Triage Vitals [04/06/21 1537]   BP Temp Temp src Heart Rate Resp SpO2 Height Weight   (!) 149/82 99.7 ??F (37.6 ??C) -- 94 (!) 6 100 % 5\' 5"  (1.651 m) 175 lb (79.4 kg)       Physical Exam  Vitals and nursing note reviewed.   Constitutional:       General: He is not in acute distress.     Appearance: Normal appearance. He is not ill-appearing.   HENT:      Head: Normocephalic and atraumatic.      Right Ear: Tympanic membrane normal.      Left Ear: Tympanic membrane normal.      Nose: Congestion present.      Mouth/Throat:  Mouth: Mucous membranes are moist.      Pharynx: Oropharynx is clear. Uvula midline. Posterior oropharyngeal erythema present. No oropharyngeal exudate.      Tonsils: No tonsillar exudate or tonsillar abscesses.   Eyes:      Pupils: Pupils are equal, round, and reactive to light.   Cardiovascular:      Rate and Rhythm: Normal rate and regular rhythm.      Heart sounds: No murmur heard.    No friction rub. No gallop.   Pulmonary:      Effort: Pulmonary effort is normal. No respiratory distress.      Breath sounds: Normal breath sounds. No wheezing.   Abdominal:      General: Abdomen is flat. Bowel sounds are normal.      Palpations: Abdomen is soft.      Tenderness: There is no abdominal tenderness. There is no right CVA tenderness or left CVA tenderness.   Musculoskeletal:         General: No deformity. Normal range of motion.      Cervical back: Normal range of motion and neck supple. No rigidity or tenderness.   Skin:     General: Skin is warm and dry.   Neurological:      General: No focal deficit present.      Mental Status: He is alert and oriented to person, place, and time.   Psychiatric:         Mood and Affect: Mood normal.         Behavior: Behavior normal.       DIAGNOSTIC RESULTS     EKG: All EKG's are interpreted by the Emergency Department Physician who either signs or Co-signs this chart in the absence of a cardiologist.    RADIOLOGY:    Non-plain film images such as CT, Ultrasound and MRI are read by the radiologist. Plain radiographic images are visualized and preliminarily interpreted by the emergency physician with the below findings:    Interpretation per the Radiologist below, if available at the time of this note:    No orders to display       LABS:  Labs Reviewed   COVID-19 & INFLUENZA COMBO [facility])    Narrative:     Is this test for diagnosis or screening?->Diagnosis of ill patient  Symptomatic for COVID-19 as defined by CDC?->Yes  Date of Symptom Onset->04/05/21  Hospitalized for COVID-19?->No  Admitted to ICU for COVID-19?->No  Pregnant:->No   RAPID STREP SCREEN       All other labs were within normal range or not returned as of this dictation.    EMERGENCY DEPARTMENT COURSE/REASSESSMENT and MDM:   MDM  Number of Diagnoses or Management Options  Strep pharyngitis  Diagnosis management comments: Healthy 36 year old male presents to the emergency department with low-grade temp 99.7 and otherwise normal vital signs. Documentation error of respirations 6, RR is 16.  He has not taken any over-the-counter medications yet today.  He is overall very well-appearing.  Easy work of breathing no apparent distress.  No nuchal rigidity or meningeal signs.  Benign and reassuring physical exam.  Lungs clear to auscultate bilaterally.   Patient has pharyngeal erythema on exam.  Uvula is midline.  Speaking in full sentences without difficulty.  Tested for strep/COVID/flu.  Given Tylenol.  Patient's strep test is positive.  Will treat him with amoxicillin.   Educated on supportive care.  Tylenol and Motrin as needed for throat pain.  Strict return precautions for new or  worsening symptoms.  He showed understanding and agreed to treatment plan.  He is well-appearing upon discharge.        CONSULTS:  None    FINAL IMPRESSION      1. Strep pharyngitis          DISPOSITION/PLAN   DISPOSITION Decision To Discharge 04/06/2021 04:11:11 PM      PATIENT  REFERRED TO:  Primary Care Provider  Call [phone]  In 1 week      DISCHARGE MEDICATIONS:  Discharge Medication List as of 04/06/2021  4:11 PM        START taking these medications    Details   amoxicillin (AMOXIL) 500 MG capsule Take 1 capsule by mouth 2 times daily for 10 days, Disp-20 capsule, R-0Print           Controlled Substances Monitoring:     No flowsheet data found.    (Please note that portions of this note were completed with a voice recognition program.  Efforts were made to edit the dictations but occasionally words are mis-transcribed.)    Janett Billow, PA-C (electronically signed)  Attending Emergency Physician           Janett Billow, PA-C  04/06/21 1620

## 2021-05-31 ENCOUNTER — Emergency Department: Admit: 2021-05-31

## 2021-05-31 ENCOUNTER — Inpatient Hospital Stay: Admit: 2021-05-31 | Discharge: 2021-06-01 | Disposition: A | Discharge status: Home / Self Care

## 2021-05-31 DIAGNOSIS — R1084 Generalized abdominal pain: Secondary | ICD-10-CM

## 2021-05-31 DIAGNOSIS — K529 Noninfective gastroenteritis and colitis, unspecified: Secondary | ICD-10-CM

## 2021-05-31 LAB — COVID-19 & INFLUENZA COMBO (LIAT HOSPITAL)
INFLUENZA A: NOT DETECTED
INFLUENZA B: NOT DETECTED
SARS-CoV-2: NOT DETECTED

## 2021-05-31 LAB — URINALYSIS W/ RFLX MICROSCOPIC
Bilirubin Urine: NEGATIVE
Glucose, UA: NEGATIVE
Ketones, Urine: NEGATIVE
Leukocyte Esterase, Urine: NEGATIVE
Nitrite, Urine: NEGATIVE
Protein, UA: NEGATIVE
Specific Gravity, UA: 1.025 (ref 1.003–1.035)
Urobilinogen, Urine: 0.2 EU/dL
pH, UA: 6 (ref 4.5–8.0)

## 2021-05-31 LAB — CBC WITH AUTO DIFFERENTIAL
Absolute Baso #: 0 10*3/uL (ref 0.0–0.2)
Absolute Eos #: 0.1 10*3/uL (ref 0.0–0.5)
Absolute Lymph #: 1.8 10*3/uL (ref 1.0–3.2)
Absolute Mono #: 0.8 10*3/uL (ref 0.3–1.0)
Basophils %: 0.4 % (ref 0.0–2.0)
Eosinophils %: 2.4 % (ref 0.0–7.0)
Hematocrit: 41.2 % (ref 38.0–52.0)
Hemoglobin: 14.3 g/dL (ref 13.0–17.3)
Immature Grans (Abs): 0.01 10*3/uL (ref 0.00–0.06)
Immature Granulocytes: 0.2 % (ref 0.0–0.6)
Lymphocytes: 36.3 % (ref 15.0–45.0)
MCH: 26.2 pg — ABNORMAL LOW (ref 27.0–34.5)
MCHC: 34.7 g/dL (ref 32.0–36.0)
MCV: 75.6 fL — ABNORMAL LOW (ref 84.0–100.0)
MPV: 9.5 fL (ref 7.2–13.2)
Monocytes: 14.8 % — ABNORMAL HIGH (ref 4.0–12.0)
Neutrophils %: 45.9 % (ref 42.0–74.0)
Neutrophils Absolute: 2.3 10*3/uL (ref 1.6–7.3)
Platelets: 271 10*3/uL (ref 140–440)
RBC: 5.45 x10e6/mcL (ref 4.00–5.60)
RDW: 14.6 % (ref 11.0–16.0)
WBC: 5.1 10*3/uL (ref 3.8–10.6)

## 2021-05-31 LAB — COMPREHENSIVE METABOLIC PANEL W/ REFLEX TO MG FOR LOW K
ALT: 35 U/L (ref 0–50)
AST: 32 U/L (ref 0–50)
Albumin/Globulin Ratio: 1.7 (ref 1.00–2.70)
Albumin: 4.3 g/dL (ref 3.5–5.2)
Alk Phosphatase: 92 U/L (ref 40–130)
Anion Gap: 12 mmol/L (ref 2–17)
BUN: 7 mg/dL (ref 6–20)
CO2: 26 mmol/L (ref 22–29)
Calcium: 8.7 mg/dL (ref 8.6–10.0)
Chloride: 101 mmol/L (ref 98–107)
Creatinine: 0.7 mg/dL (ref 0.7–1.3)
Est, Glom Filt Rate: 124 mL/min/1.73m (ref >=60)
Globulin: 3 g/dL (ref 1.9–4.4)
Glucose: 116 mg/dL — ABNORMAL HIGH (ref 70–99)
OSMOLALITY CALCULATED: 277 mOsm/kg (ref 270–287)
Potassium: 3.6 mmol/L (ref 3.5–5.3)
Sodium: 139 mmol/L (ref 135–145)
Total Bilirubin: 0.6 mg/dL (ref 0.00–1.20)
Total Protein: 6.8 g/dL (ref 6.4–8.3)

## 2021-05-31 LAB — LIPASE: Lipase: 30 U/L (ref 13–60)

## 2021-05-31 MED ORDER — ALUM & MAG HYDROXIDE-SIMETH 200-200-20 MG/5ML PO SUSP
200-200-20 MG/5ML | ORAL | Status: DC
Start: 2021-05-31 — End: 2021-05-31

## 2021-05-31 MED ORDER — HYOSCYAMINE SULFATE 0.125 MG SL SUBL
125 MCG | Freq: Once | SUBLINGUAL | Status: AC
Start: 2021-05-31 — End: 2021-05-31
  Administered 2021-05-31: 23:00:00 125 ug via SUBLINGUAL

## 2021-05-31 MED ORDER — ONDANSETRON HCL 4 MG/2ML IJ SOLN
4 MG/2ML | INTRAMUSCULAR | Status: AC
Start: 2021-05-31 — End: 2021-05-31
  Administered 2021-05-31: 23:00:00 4 via INTRAVENOUS

## 2021-05-31 MED ORDER — SODIUM CHLORIDE 0.9 % IV BOLUS
0.9 % | Freq: Once | INTRAVENOUS | Status: AC
Start: 2021-05-31 — End: 2021-05-31
  Administered 2021-05-31: 23:00:00 1000 mL via INTRAVENOUS

## 2021-05-31 MED ORDER — ONDANSETRON HCL 4 MG/2ML IJ SOLN
4 MG/2ML | Freq: Once | INTRAMUSCULAR | Status: AC
Start: 2021-05-31 — End: 2021-05-31

## 2021-05-31 MED ORDER — IOPAMIDOL 61 % IV SOLN
61 % | Freq: Once | INTRAVENOUS | Status: AC | PRN
Start: 2021-05-31 — End: 2021-05-31
  Administered 2021-05-31: 100 mL via INTRAVENOUS

## 2021-05-31 MED ORDER — ALUM & MAG HYDROXIDE-SIMETH 200-200-20 MG/5ML PO SUSP
200-200-205 MG/5ML | Freq: Once | ORAL | Status: AC
Start: 2021-05-31 — End: 2021-05-31
  Administered 2021-05-31: 23:00:00 via ORAL

## 2021-05-31 MED FILL — MAG-AL PLUS 200-200-20 MG/5ML PO LIQD: 200-200-20 MG/5ML | ORAL | Qty: 30

## 2021-05-31 MED FILL — HYOSCYAMINE SULFATE 0.125 MG SL SUBL: 125 MCG | SUBLINGUAL | Qty: 1

## 2021-05-31 MED FILL — ONDANSETRON HCL 4 MG/2ML IJ SOLN: 4 MG/2ML | INTRAMUSCULAR | Qty: 2

## 2021-05-31 NOTE — ED Provider Notes (Signed)
[facility] EM[facility]Y DEPT  EMERGENCY DEPARTMENT ENCOUNTER      Pt Name: Spencer Strickland  MRN: [id number]  Birthdate November 08, 1985  Date of evaluation: 05/31/2021  Provider: Janett Billow, PA-C    CHIEF COMPLAINT       Chief Complaint   Patient presents with    Abdominal Pain     Patient ambulatory to room with to go bag of food. Patient reports abdominal pain in mid upper quadrant of stomach. Patient reports nausea and episode of emesis where there was "medium colored blood". Patient also reports diarrhea and chills/ sweats. Denies urinary frequency or painful urination. Resp. Even and non-labored. NAD noted.            HISTORY OF PRESENT ILLNESS    The history is provided by the patient.     36 y.o. male presents to the ER with abdominal pain x1 week associated with intermittent nausea, vomiting and diarrhea. Patient states that the abdominal pain is constant but fluctuating intensity with no specific exacerbating or alleviating factors.  It feels very crampy in nature.  He localizes it to his periumbilical area as well as the right abdomen.  He vomited 1 time today.  Nonbilious nonbloody emesis.  He does endorse one episode of blood-tinged emesis a few days ago.  He has had 4-5 episodes of diarrhea with brown watery stool today.  Also endorses intermittent chills and sweats.  No fevers.  Denies recent antibiotic use or travel.  No history of abdominal surgeries.  Denies any other symptoms. Has not taken any OTC medications today.  No fevers, headache, dizziness, syncope, chest pain, shortness of breath, cough, back pain, dysuria, hematuria, constipation, blood in stool.      Nursing Notes were reviewed.    REVIEW OF SYSTEMS     Review of Systems   All other systems reviewed and are negative.    Except as noted above the remainder of the review of systems was reviewed and negative.       PAST MEDICAL HISTORY   History reviewed. No pertinent past medical history.    SURGICAL HISTORY     History reviewed. No  pertinent surgical history.    CURRENT MEDICATIONS       Discharge Medication List as of 05/31/2021  8:38 PM          ALLERGIES     Patient has no known allergies.    FAMILY HISTORY     History reviewed. No pertinent family history.     SOCIAL HISTORY       Social History     Socioeconomic History    Marital status: Single     Spouse name: None    Number of children: None    Years of education: None    Highest education level: None   Tobacco Use    Smoking status: Every Day     Packs/day: 0.50     Types: Cigarettes   Substance and Sexual Activity    Alcohol use: Not Currently       SCREENINGS         Glasgow Coma Scale  Eye Opening: Spontaneous  Best Verbal Response: Oriented  Best Motor Response: Obeys commands  Glasgow Coma Scale Score: 15                     CIWA Assessment  BP: 129/79  Heart Rate: 79  PHYSICAL EXAM       ED Triage Vitals [05/31/21 1834]   BP Temp Temp Source Heart Rate Resp SpO2 Height Weight   138/84 97.3 ??F (36.3 ??C) Oral 72 19 100 % 5\' 5"  (1.651 m) 172 lb 12.8 oz (78.4 kg)       Physical Exam  Vitals and nursing note reviewed.   Constitutional:       General: He is not in acute distress.     Appearance: Normal appearance. He is not ill-appearing.   HENT:      Head: Normocephalic and atraumatic.      Right Ear: Tympanic membrane normal.      Mouth/Throat:      Mouth: Mucous membranes are moist.   Eyes:      Pupils: Pupils are equal, round, and reactive to light.   Cardiovascular:      Rate and Rhythm: Normal rate and regular rhythm.      Heart sounds: No murmur heard.    No friction rub. No gallop.   Pulmonary:      Effort: Pulmonary effort is normal. No respiratory distress.      Breath sounds: Normal breath sounds. No wheezing.   Abdominal:      General: Abdomen is flat. Bowel sounds are normal.      Palpations: Abdomen is soft.      Tenderness: There is abdominal tenderness (mild) in the periumbilical area. There is no right CVA tenderness, left CVA tenderness, guarding or  rebound.   Musculoskeletal:         General: No deformity. Normal range of motion.      Cervical back: Normal range of motion and neck supple.   Skin:     General: Skin is warm and dry.      Capillary Refill: Capillary refill takes less than 2 seconds.   Neurological:      General: No focal deficit present.      Mental Status: He is alert and oriented to person, place, and time.   Psychiatric:         Mood and Affect: Mood normal.         Behavior: Behavior normal.       DIAGNOSTIC RESULTS     EKG: All EKG's are interpreted by the Emergency Department Physician who either signs or Co-signs this chart in the absence of a cardiologist.    RADIOLOGY:   Non-plain film images such as CT, Ultrasound and MRI are read by the radiologist. Plain radiographic images are visualized and preliminarily interpreted by the emergency physician with the below findings:    Interpretation per the Radiologist below, if available at the time of this note:    CT ABDOMEN PELVIS W IV CONTRAST Additional Contrast? None   Final Result   No acute intra-abdominal or pelvic abnormality.           LABS:  Labs Reviewed   CBC WITH AUTO DIFFERENTIAL - Abnormal; Notable for the following components:       Result Value    MCV 75.6 (*)     MCH 26.2 (*)     Monocytes 14.8 (*)     All other components within normal limits   COMPREHENSIVE METABOLIC PANEL W/ REFLEX TO MG FOR LOW K - Abnormal; Notable for the following components:    Glucose 116 (*)     All other components within normal limits   COVID-19 & INFLUENZA COMBO [facility])    Narrative:  Is this test for diagnosis or screening?->Diagnosis of ill patient  Symptomatic for COVID-19 as defined by CDC?->Yes  Date of Symptom Onset->05/31/21  Hospitalized for COVID-19?->No  Admitted to ICU for COVID-19?->No  Pregnant:->No   LIPASE   URINALYSIS W/ RFLX MICROSCOPIC       All other labs were within normal range or not returned as of this dictation.    PROCEDURE:  Procedures    EMERGENCY DEPARTMENT  COURSE/REASSESSMENT and MDM:   MDM  Number of Diagnoses or Management Options  Gastroenteritis  Generalized abdominal pain  Nausea vomiting and diarrhea  Diagnosis management comments: Patient is a well-appearing 36 year old male who is ambulatory to the emergency department.  He is afebrile with normal vital signs.  Presents with 1 week of constant but fluctuating in intensity crampy abdominal pain localized more so periumbilically and to the right mid abdomen.  He has associated nausea, vomiting, diarrhea him intermittently.  4-5 episodes of diarrhea today with 1 episode of vomiting.  Mild tenderness to the periumbilicus and otherwise has a benign and reassuring physical exam.  Given fluids, ordered labs and CT scan due to abdominal pain for a week with mild tenderness.  Lab work is unremarkable.  CT scan is negative.  He continues to rest comfortably no apparent distress.  Repeat examination shows benign nontender abdomen.  I will treat him for gastroenteritis with Zofran and Bentyl.  Given strict return precautions for new or worsening symptoms.  He showed understanding and agreed to treatment plan.  He is well-appearing upon discharge.        CONSULTS:  None          FINAL IMPRESSION      1. Generalized abdominal pain    2. Nausea vomiting and diarrhea    3. Gastroenteritis          DISPOSITION/PLAN   DISPOSITION Decision To Discharge 05/31/2021 08:36:25 PM      PATIENT REFERRED TO:  Primary Care Provider  Call [phone]  In 1 week      [facility]  [address]  [place] [id number]  [phone]  In 1 week      DISCHARGE MEDICATIONS:  Discharge Medication List as of 05/31/2021  8:38 PM        START taking these medications    Details   ondansetron (ZOFRAN-ODT) 4 MG disintegrating tablet Take 1 tablet by mouth 3 times daily as needed for Nausea or Vomiting, Disp-21 tablet, R-0Print      dicyclomine (BENTYL) 20 MG tablet Take 1 tablet by mouth every 6 hours as needed  (Abdominal Pain), Disp-30 tablet, R-0Print           Controlled Substances Monitoring:     No flowsheet data found.    (Please note that portions of this note were completed with a voice recognition program.  Efforts were made to edit the dictations but occasionally words are mis-transcribed.)    Janett BillowSarah Kelly Leesa Leifheit, PA-C (electronically signed)  Attending Emergency Physician           Janett BillowSarah Kelly Mame Twombly, PA-C  05/31/21 2136

## 2021-06-01 MED ORDER — DICYCLOMINE HCL 20 MG PO TABS
20 MG | ORAL_TABLET | Freq: Four times a day (QID) | ORAL | 0 refills | Status: AC | PRN
Start: 2021-06-01 — End: ?

## 2021-06-01 MED ORDER — ONDANSETRON 4 MG PO TBDP
4 MG | ORAL_TABLET | Freq: Three times a day (TID) | ORAL | 0 refills | Status: AC | PRN
Start: 2021-06-01 — End: ?

## 2021-07-25 ENCOUNTER — Inpatient Hospital Stay: Admit: 2021-07-25 | Discharge: 2021-07-25 | Disposition: A | Discharge status: Home / Self Care

## 2021-07-25 ENCOUNTER — Emergency Department: Admit: 2021-07-25

## 2021-07-25 DIAGNOSIS — R1031 Right lower quadrant pain: Secondary | ICD-10-CM

## 2021-07-25 LAB — POC URINALYSIS, CHEMISTRY
Bilirubin, Urine, POC: NEGATIVE
Blood, UA POC: NEGATIVE
Glucose, UA POC: NEGATIVE mg/dL
Ketones, Urine, POC: NEGATIVE mg/dL
Leukocytes, UA: NEGATIVE
Nitrate, UA POC: NEGATIVE
Protein, Urine, POC: NEGATIVE
Specific Gravity, Urine, POC: 1.015 (ref 1.003–1.035)
Urine Urobilinogen: 0.2 EU/dL
pH, Urine, POC: 7 (ref 4.5–8.0)

## 2021-07-25 LAB — COMPREHENSIVE METABOLIC PANEL
ALT: 31 U/L (ref 0–50)
AST: 25 U/L (ref 0–50)
Albumin/Globulin Ratio: 1.7 (ref 1.00–2.70)
Albumin: 4.3 g/dL (ref 3.5–5.2)
Alk Phosphatase: 94 U/L (ref 40–130)
Anion Gap: 9 mmol/L (ref 2–17)
BUN: 11 mg/dL (ref 6–20)
CO2: 26 mmol/L (ref 22–29)
Calcium: 9.1 mg/dL (ref 8.6–10.0)
Chloride: 104 mmol/L (ref 98–107)
Creatinine: 0.7 mg/dL (ref 0.7–1.3)
Est, Glom Filt Rate: 124 mL/min/1.73m (ref >=60)
Globulin: 3 g/dL (ref 1.9–4.4)
Glucose: 105 mg/dL — ABNORMAL HIGH (ref 70–99)
OSMOLALITY CALCULATED: 275 mOsm/kg (ref 270–287)
Potassium: 4.2 mmol/L (ref 3.5–5.3)
Sodium: 138 mmol/L (ref 135–145)
Total Bilirubin: 0.45 mg/dL (ref 0.00–1.20)
Total Protein: 6.8 g/dL (ref 6.4–8.3)

## 2021-07-25 LAB — CBC WITH AUTO DIFFERENTIAL
Absolute Baso #: 0 10*3/uL (ref 0.0–0.2)
Absolute Eos #: 0.1 10*3/uL (ref 0.0–0.5)
Absolute Lymph #: 1.9 10*3/uL (ref 1.0–3.2)
Absolute Mono #: 0.9 10*3/uL (ref 0.3–1.0)
Basophils %: 0.3 % (ref 0.0–2.0)
Eosinophils %: 1 % (ref 0.0–7.0)
Hematocrit: 42.4 % (ref 38.0–52.0)
Hemoglobin: 14.5 g/dL (ref 13.0–17.3)
Immature Grans (Abs): 0.03 10*3/uL (ref 0.00–0.06)
Immature Granulocytes: 0.3 % (ref 0.0–0.6)
Lymphocytes: 18.5 % (ref 15.0–45.0)
MCH: 26.9 pg — ABNORMAL LOW (ref 27.0–34.5)
MCHC: 34.2 g/dL (ref 32.0–36.0)
MCV: 78.5 fL — ABNORMAL LOW (ref 84.0–100.0)
MPV: 8.9 fL (ref 7.2–13.2)
Monocytes: 8.5 % (ref 4.0–12.0)
Neutrophils %: 71.4 % (ref 42.0–74.0)
Neutrophils Absolute: 7.2 10*3/uL (ref 1.6–7.3)
Platelets: 270 10*3/uL (ref 140–440)
RBC: 5.4 x10e6/mcL (ref 4.00–5.60)
RDW: 15.5 % (ref 11.0–16.0)
WBC: 10 10*3/uL (ref 3.8–10.6)

## 2021-07-25 LAB — LIPASE: Lipase: 12 U/L — ABNORMAL LOW (ref 13–60)

## 2021-07-25 MED ORDER — IOPAMIDOL 61 % IJ SOLN
61 % | Freq: Once | INTRAMUSCULAR | Status: AC | PRN
Start: 2021-07-25 — End: 2021-07-25
  Administered 2021-07-25: 14:00:00 100 mL via INTRAVENOUS

## 2021-07-25 MED ORDER — KETOROLAC TROMETHAMINE 15 MG/ML IJ SOLN
15 MG/ML | Freq: Once | INTRAMUSCULAR | Status: AC
Start: 2021-07-25 — End: 2021-07-25
  Administered 2021-07-25: 14:00:00 15 mg via INTRAVENOUS

## 2021-07-25 MED ORDER — SODIUM CHLORIDE 0.9 % IV BOLUS
0.9 % | Freq: Once | INTRAVENOUS | Status: AC
Start: 2021-07-25 — End: 2021-07-25
  Administered 2021-07-25: 14:00:00 1000 mL via INTRAVENOUS

## 2021-07-25 MED ORDER — DIPHENOXYLATE-ATROPINE 2.5-0.025 MG PO TABS
Freq: Once | ORAL | Status: DC
Start: 2021-07-25 — End: 2021-07-25

## 2021-07-25 MED FILL — KETOROLAC TROMETHAMINE 15 MG/ML IJ SOLN: 15 MG/ML | INTRAMUSCULAR | Qty: 1

## 2021-07-25 NOTE — ED Provider Notes (Signed)
[facility] DEPT  EMERGENCY DEPARTMENT ENCOUNTER      Pt Name: Spencer BottsLindell Strickland Strickland  MRN: [id number]  Birthdate 24-Mar-1985  Date of evaluation: 07/25/2021  Provider: Vincenza HewsGRAY Brendalee Matthies PA-C    CHIEF COMPLAINT       Chief Complaint   Patient presents with    Abdominal Pain     Pt c/o lower abd pain with diarrhea x3 days         HISTORY OF PRESENT ILLNESS   (Location/Symptom, Timing/Onset, Context/Setting, Quality, Duration, Modifying Factors, Severity)  Note limiting factors.   [address] Emelda Strickland is a 36 y.o. male presents here today with complaints of diarrhea abdominal pain.  Started 3 days ago.  Says he is having 5-6 loose stools each day and a cramping lower abdominal pain in his right lower quadrant.  Says he has not had much of an appetite.  Denies any fever, chills, night sweats, dysuria, urgency, frequency.        Nursing Notes were reviewed.    REVIEW OF SYSTEMS    (2-9 systems for level 4, 10 or more for level 5)     Review of Systems    Except as noted above the remainder of the review of systems was reviewed and negative.     PAST MEDICAL HISTORY   No past medical history on file.    SURGICAL HISTORY     No past surgical history on file.    CURRENT MEDICATIONS       Previous Medications    DICYCLOMINE (BENTYL) 20 MG TABLET    Take 1 tablet by mouth every 6 hours as needed (Abdominal Pain)    ONDANSETRON (ZOFRAN-ODT) 4 MG DISINTEGRATING TABLET    Take 1 tablet by mouth 3 times daily as needed for Nausea or Vomiting       ALLERGIES     Patient has no known allergies.    FAMILY HISTORY     No family history on file.     SOCIAL HISTORY       Social History     Socioeconomic History    Marital status: Single   Tobacco Use    Smoking status: Every Day     Packs/day: 0.50     Types: Cigarettes   Substance and Sexual Activity    Alcohol use: Not Currently       SCREENINGS         Glasgow Coma Scale  Eye Opening: Spontaneous  Best Verbal Response: Oriented  Best Motor Response: Obeys commands  Glasgow Coma Scale  Score: 15                     CIWA Assessment  BP: (!) 142/81  Pulse: 66                 PHYSICAL EXAM    (up to 7 for level 4, 8 or more for level 5)     ED Triage Vitals [07/25/21 0914]   BP Temp Temp Source Pulse Respirations SpO2 Height Weight - Scale   (!) 142/81 97.9 F (36.6 C) Oral 66 16 99 % 5\' 5"  (1.651 m) 190 lb (86.2 kg)       Physical Exam  Constitutional:       General: He is not in acute distress.     Appearance: Normal appearance. He is not ill-appearing, toxic-appearing or diaphoretic.   HENT:      Head: Normocephalic and atraumatic.  Nose: Nose normal.      Mouth/Throat:      Mouth: Mucous membranes are moist.   Eyes:      Conjunctiva/sclera: Conjunctivae normal.   Abdominal:      Comments: Right lower quadrant abdominal tenderness palpation mild suprapubic left lower quadrant abdominal tenderness palpation as well.  No guarding no rebound no peritoneal signs.  Bowel sounds normal.   Musculoskeletal:         General: Normal range of motion.      Cervical back: Normal range of motion.   Skin:     General: Skin is warm and dry.   Neurological:      General: No focal deficit present.      Mental Status: He is alert and oriented to person, place, and time.   Psychiatric:         Mood and Affect: Mood normal.         Behavior: Behavior normal.         Thought Content: Thought content normal.         Judgment: Judgment normal.       PROCEDURES:  Unless otherwise noted below, none     Procedures    DIAGNOSTIC RESULTS     EKG: All EKG's are interpreted by the Emergency Department Provider who either signs or Co-signs this chart in the absence of a cardiologist.    RADIOLOGY:   All imaging reviewed.     Interpretation per the Radiologist below, if available at the time of this note:    CT ABDOMEN PELVIS W IV CONTRAST Additional Contrast? None   Final Result   No acute intra-abdominal or pelvic abnormality.           LABS:  Labs Reviewed   CBC WITH AUTO DIFFERENTIAL - Abnormal; Notable for the following  components:       Result Value    MCV 78.5 (*)     MCH 26.9 (*)     All other components within normal limits   COMPREHENSIVE METABOLIC PANEL - Abnormal; Notable for the following components:    Glucose 105 (*)     All other components within normal limits   LIPASE - Abnormal; Notable for the following components:    Lipase 12 (*)     All other components within normal limits   POC URINALYSIS, CHEMISTRY       All other labs were within normal range or not returned as of this dictation.    EMERGENCY DEPARTMENT COURSE and DIFFERENTIAL DIAGNOSIS/MDM:   Vitals:    Vitals:    07/25/21 0914   BP: (!) 142/81   Pulse: 66   Resp: 16   Temp: 97.9 F (36.6 C)   TempSrc: Oral   SpO2: 99%   Weight: 86.2 kg   Height: 5\' 5"  (1.651 m)         MDM  Number of Diagnoses or Management Options  Diagnosis management comments: CT scan shows no acute intra-abdominal pathology.  Specifically normal appendix.  Suspect most likely gastroenteritis.  Patient doing better after Toradol and fluids.  We will give him single dose of Lomotil.  Stable for discharge.  Return precautions discussed.        REASSESSMENT              CONSULTS:  None    FINAL IMPRESSION      1. Abdominal pain, right lower quadrant    2. Diarrhea, unspecified type  DISPOSITION/PLAN   DISPOSITION Decision To Discharge 07/25/2021 10:32:19 AM      PATIENT REFERRED TO:  Devin Going, DO  [address]  [place] [address]  [phone]            DISCHARGE MEDICATIONS:  New Prescriptions    No medications on file       Controlled Substances Monitoring:     No flowsheet data found.    (Please note that portions of this note were completed with a voice recognition program.  Efforts were made to edit the dictations but occasionally words are mis-transcribed.)    Donne Hazel, PA-C (electronically signed)  Attending Emergency Physician            Donne Hazel, PA-C  07/25/21 1032

## 2022-04-16 NOTE — Telephone Encounter (Signed)
MyChart activation project/number has been disconnected.
